# Patient Record
Sex: Female | Born: 1980 | Race: White | Hispanic: No
Health system: Midwestern US, Academic
[De-identification: ages and names within clinical notes are randomized; demographics above are authoritative.]

---

## 2019-02-27 ENCOUNTER — Encounter: Admit: 2019-02-27 | Discharge: 2019-02-27 | Primary: Primary Care

## 2019-02-27 DIAGNOSIS — R51 Headache: Secondary | ICD-10-CM

## 2019-02-27 DIAGNOSIS — K625 Hemorrhage of anus and rectum: Secondary | ICD-10-CM

## 2019-02-28 ENCOUNTER — Encounter: Admit: 2019-02-28 | Discharge: 2019-02-28 | Primary: Primary Care

## 2019-02-28 ENCOUNTER — Ambulatory Visit: Admit: 2019-02-28 | Discharge: 2019-03-01 | Primary: Primary Care

## 2019-02-28 DIAGNOSIS — K625 Hemorrhage of anus and rectum: Secondary | ICD-10-CM

## 2019-02-28 DIAGNOSIS — R51 Headache: Secondary | ICD-10-CM

## 2019-02-28 MED ORDER — NORTRIPTYLINE 25 MG PO CAP
25 mg | ORAL_CAPSULE | Freq: Every evening | ORAL | 5 refills | Status: AC
Start: 2019-02-28 — End: ?

## 2019-02-28 MED ORDER — RIZATRIPTAN 5 MG PO TBDI
5 mg | ORAL_TABLET | Freq: Every day | ORAL | 3 refills | Status: AC | PRN
Start: 2019-02-28 — End: ?

## 2019-02-28 NOTE — Progress Notes
#   of headache days out of last month:  15/30  Average duration of headaches: 24+ hours  Average severity of pain : 8/10    Is this your typical headache/migraine? yes  Any change in location/quality? no    When headaches occur:   Light sensitivity: yes  Sound sensitivity: yes  Nausea: yes  Vomiting: yes    # days over the counter medication: 15+, ibuprofen sometimes helps  # days of triptans, if any prescribed: none   # days missed work/activities per month:# none

## 2019-02-28 NOTE — Progress Notes
Obtained patient's verbal consent to treat them and their agreement to [facility] financial policy and NPP via this telehealth visit during the [facility]    The following visit was completed via Zoom (Audio/Video).    Rachael Parks is a 38 y.o. female.    CC: headaches       History of Present Illness    She presents for evaluation of headaches. These have occurred since age 4. Last saw a physician for headaches 15 years ago.    She describes migraine and headaches. With migraines she gets blurred spots in the vision. She will take 4-6 ibuprofen then take a nap. The headache then may last for 3 days. These generally consist of a dull pain. Migraines will last for 2 days if treated with ibuprofen and sleep. With migraine pain tends to be retro-orbital, but affects the whole head. Has light and sound sensitivity, nausea, and emesis.     Has at least 15-20 days with headache per month. Between 2018-2019 she presented to the ED a couple times for sudden headaches     Current meds:  Takes [phone] ibuprofen essenitally daily.    Prior meds:  topamax - tried 15 years ago. took for 2 months. no reduction in headache frequency.       Medical History:   Diagnosis Date   ??? Headache    ??? Rectal bleeding      Surgical History:   Procedure Laterality Date   ??? UMBILICAL HERNIA REPAIR       Social History     Socioeconomic History   ??? Marital status: Divorced     Spouse name: Not on file   ??? Number of children: Not on file   ??? Years of education: Not on file   ??? Highest education level: Associate degree: academic program   Occupational History   ??? Not on file   Tobacco Use   ??? Smoking status: Current Every Day Smoker     Packs/day: 1.00     Years: 21.00     Pack years: 21.00     Types: Cigarettes   ??? Smokeless tobacco: Never Used   Substance and Sexual Activity   ??? Alcohol use: Never     Frequency: Never   ??? Drug use: Never   ??? Sexual activity: Not on file   Other Topics Concern   ??? Not on file Social History Narrative   ??? Not on file     No family history on file.    Review of Systems      Objective:         ??? ALBUTEROL IN Inhale  by mouth into the lungs as Needed.   ??? IBUPROFEN PO Take  by mouth. 3600-4800mg  daily per PCP note   ??? omeprazole DR (PRILOSEC) 20 mg capsule Take 20 mg by mouth daily.   ??? ondansetron (ZOFRAN ODT) 4 mg rapid dissolve tablet DISSOLVE 1 TABLET IN MOUTH EVERY 8 HOURS AS NEEDED FOR NAUSEA AND VOMITING     Vitals:    02/28/19 0927   Weight: 88.5 kg (195 lb)   Height: 170.2 cm (67)   PainSc: Six     Body mass index is 30.54 kg/m???.     Physical Exam    Alert and in no distress.  Converses and answers questions appropriately.  Speech is normal without dysarthria.  Face is symmetric with normal movements.         Assessment and Plan:  Impression: Chronic migraine with 15-20 headache days per month, estimates 8 days per month with migraine specifically. Discussed the classification of chronic migraine which is characterized by greater than 15 days of headache per month and the role of both abortive and preventative treatments. Discussed the role of traditional medications for headache prevention as well as FDA approved treatments which include botulinum injections and CGRP antagonists.     Plan:  1. Start 25mg  nortriptyline nightly for prevention.  2. Start 5mg  maxalt as needed for migraine.  3. Obtain MRI head w/o contrast.  4. Can continue to use ibuprofen as needed. Would not take more than 800mg  at a time and recommend using no more than 10-12 days per month.  5. Follow up in about 4 months.    Total time 36 minutes.  Estimated counseling time 20 minutes.  Counseled patient regarding migraine prevention and acute treatment.

## 2019-03-01 DIAGNOSIS — G43709 Chronic migraine without aura, not intractable, without status migrainosus: Principal | ICD-10-CM

## 2019-03-08 ENCOUNTER — Encounter: Admit: 2019-03-08 | Discharge: 2019-03-08 | Primary: Primary Care

## 2019-03-08 DIAGNOSIS — G43709 Chronic migraine without aura, not intractable, without status migrainosus: Secondary | ICD-10-CM

## 2019-11-14 IMAGING — CT STONE PROTOCOL(Adult)
2 of 3 series · 12 of 46 positions shown, 14 images · non-contrast
Comparison: none

[Series 2: abdomen ax 3.00 br40 s3 · axial · 0.59mm/px · z∈[+1181,+1607]mm · 9 of 162 slices shown, 11 images]
[im 11/162  soft-tissue]
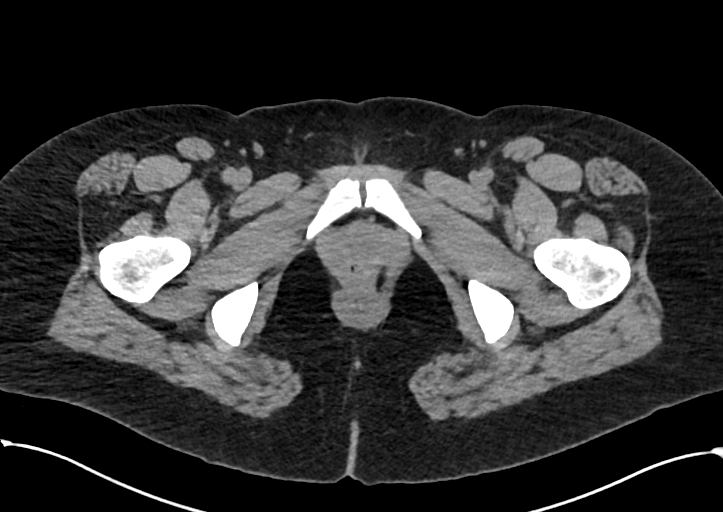
[im 11/162  bone]
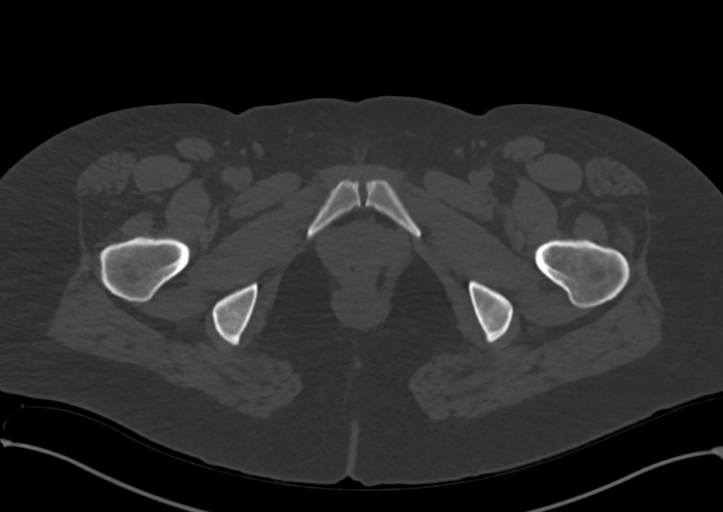
[im 32/162  soft-tissue]
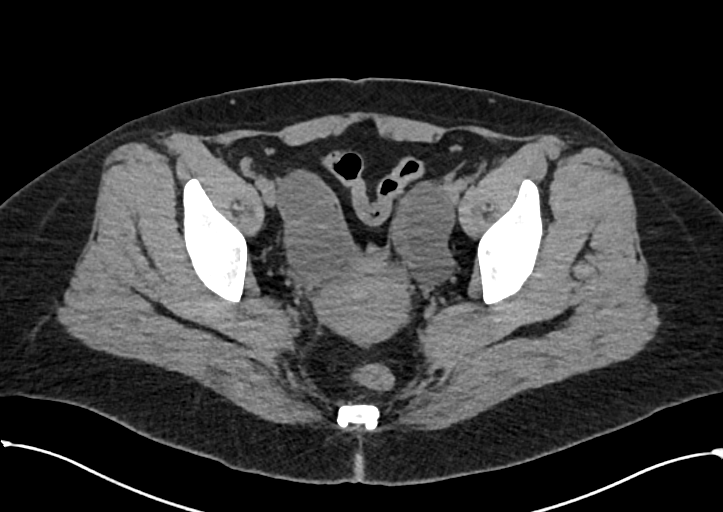
[im 47/162  soft-tissue]
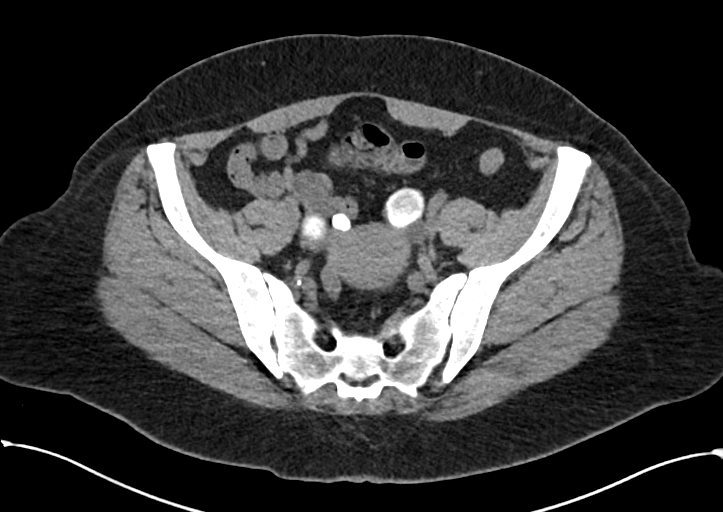
[im 63/162  soft-tissue]
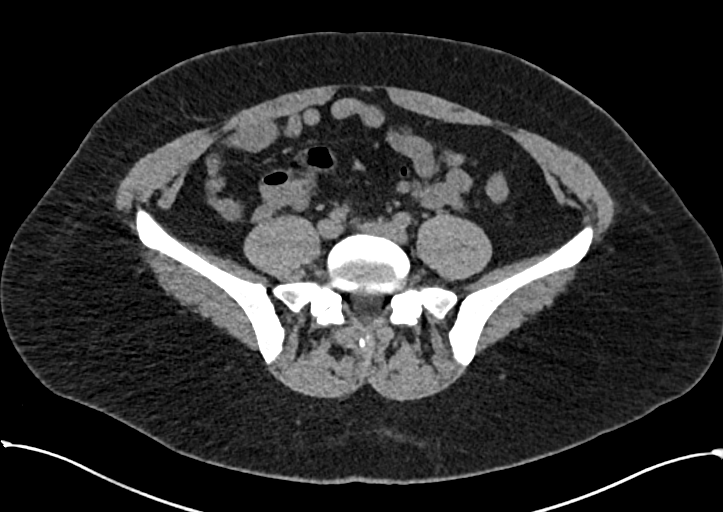
[im 84/162  soft-tissue]
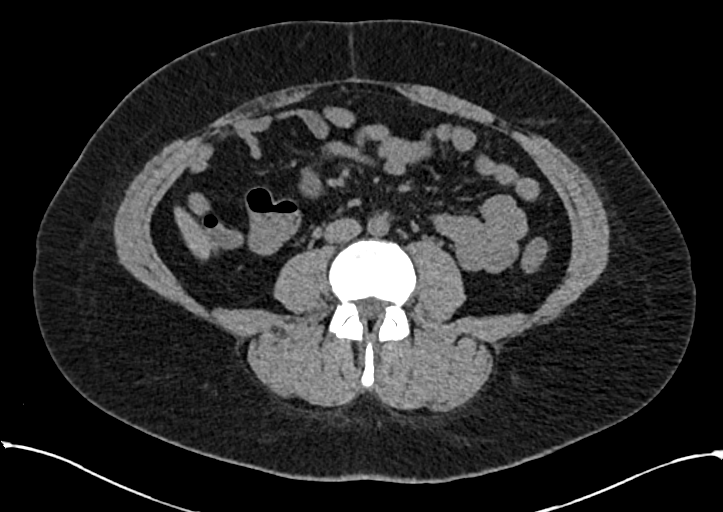
[im 99/162  soft-tissue]
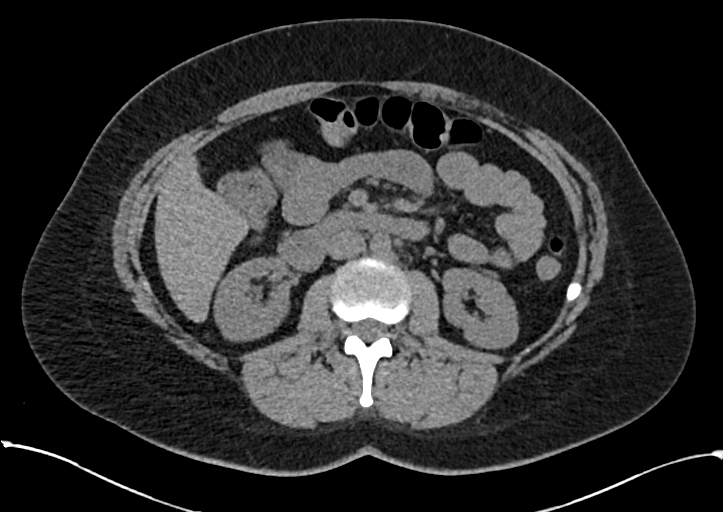
[im 115/162  soft-tissue]
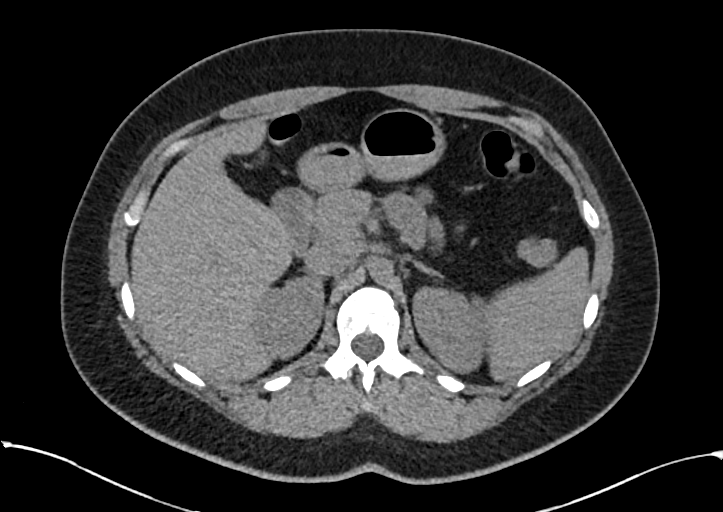
[im 136/162  soft-tissue]
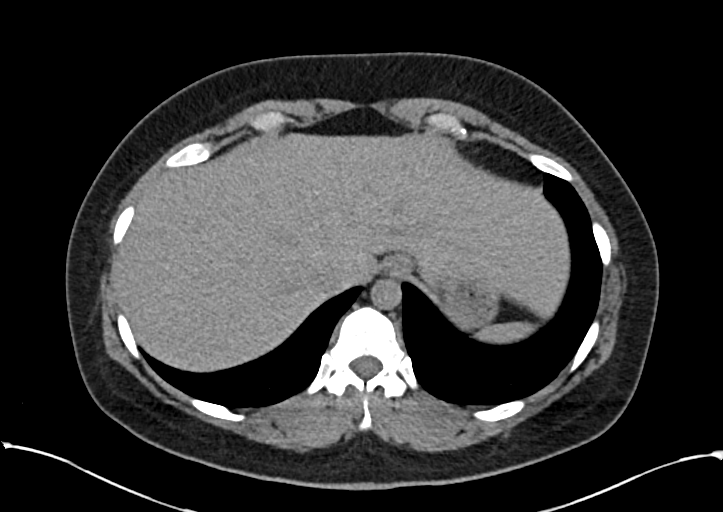
[im 151/162  soft-tissue]
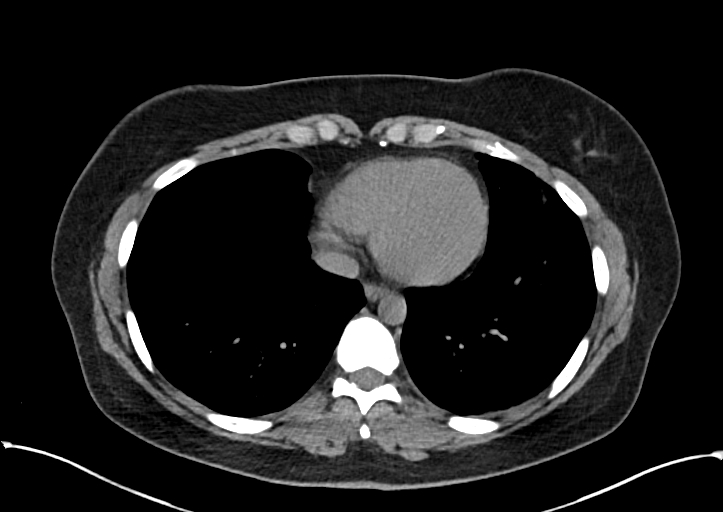
[im 151/162  bone]
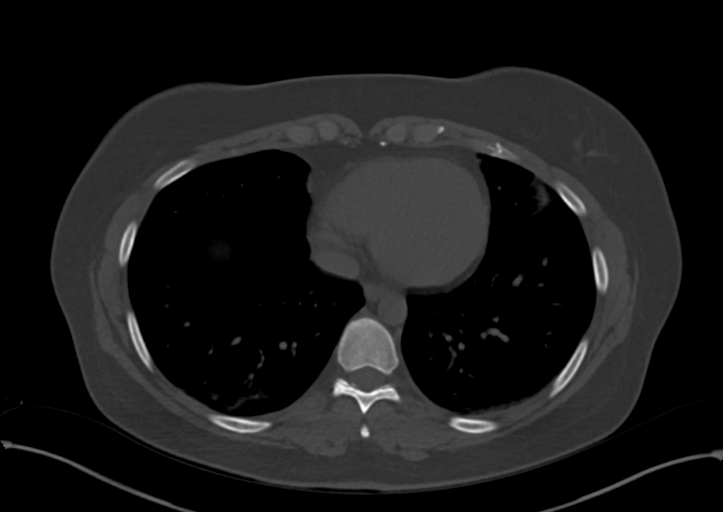

[Series 4: abdomen cor 3.00 br40 s3 · coronal · 0.83mm/px · 3 of 99 slices shown]
[im 33/99  soft-tissue]
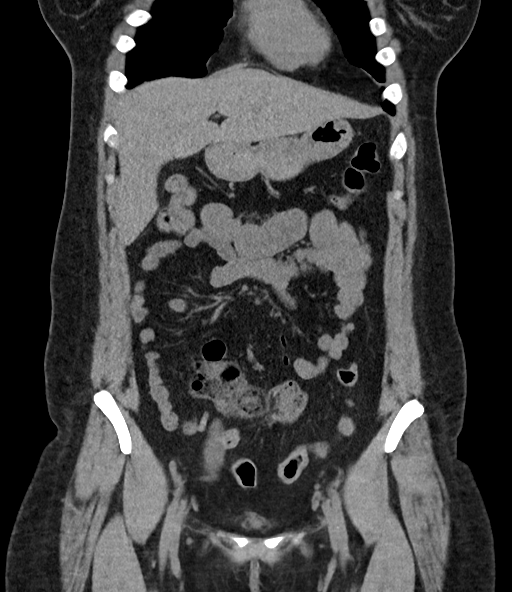
[im 44/99  soft-tissue]
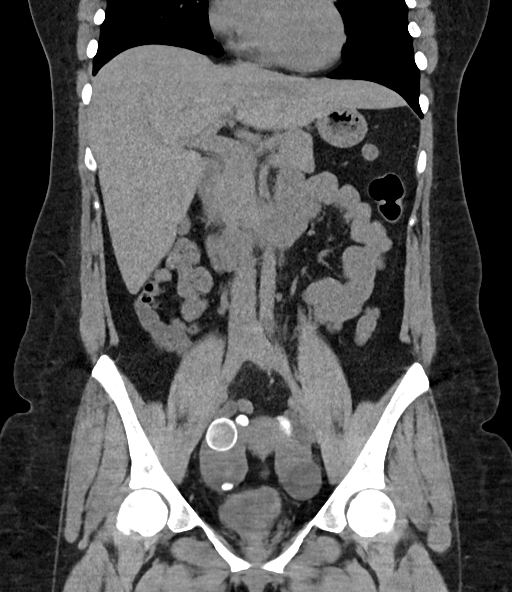
[im 55/99  soft-tissue]
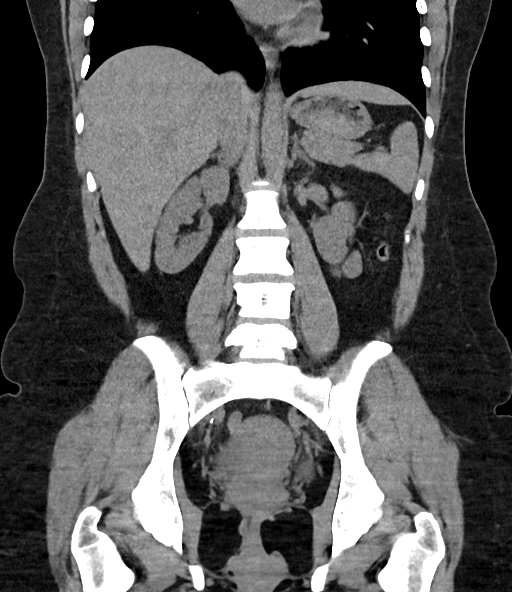

[12 of 46 positions shown; findings below may reference images not displayed]

EXAM

CT abd/pel wo con

INDICATION

Right lower quadrant abdominal pain, history of kidney stones

TECHNIQUE

CT of the abdomen and pelvis was performed. All CT scans at this facility use dose modulation,
iterative reconstruction, and/or weight based dosing when appropriate to reduce radiation dose to as
low as reasonably achieved.

# of CT scans in the past year: 0

# of Myocardial perfusion scans this past year: 0

COMPARISONS

01/22/2019

FINDINGS

Lung bases: No pleural effusion or suspicious pulmonary nodule in the lung bases. Normal cardiac
size without a pericardial effusion.

Liver: Normal hepatic size without a suspicious focal lesion.

Gallbladder and Biliary Tree: Cholecystectomy. No intrahepatic or extrahepatic biliary dilation.

Spleen: Unremarkable

Pancreas: Unremarkable

Adrenal Glands: Unremarkable

Kidneys: Symmetric size of the kidneys. Trace 1 millimeter hyperdensity which may represent a small
nonobstructive kidney stone within the right kidney (series 2, image 60). No hydroureteronephrosis
or renal stone.

Bladder: Decompressed limiting its evaluation

Pelvic Organs: Probably calcified subserosal/exophytic fundal myometrial possible fibroids
measuring up to 3.1 cm. There are at least 3 exophytic calcified fibroid spurred prominent cystic
filled bilateral ovaries measuring up to 6.2 cm.

Bowel: The stomach is normal.   There is normal caliber of the small and large bowel without
evidence of bowel obstruction.   Normal appendix visualized within the the mid aspect of the lower
abdomen (series 2, image 98). The appendix is filled with air. There is no free air.

Ascites: Absent

Lymphadenopathy: No pathologically enlarged or morphologically abnormal lymph nodes by CT
appearance.

Vasculature: No aneurysmal dilatation

Abdominal Wall and Mesentery: Small fat containing umbilical

Musculoskeletal: No acute fracture or aggressive focal osseous lesion

IMPRESSION
1. No CT evidence of an acute abdominal or pelvic process.
2. Trace 1 millimeter nonobstructive right kidney stone. No hydronephrosis.
3. At least 3 exophytic calcified subserosal fibroids as detailed above.
4. Unchanged appearance of the cystic dilatation of bilateral ovaries.
5. Normal appendix.

Tech Notes:

Pt c/o RLQ pain. H/o kidney stones, cholecystectomy, and hernia repair. Neg. preg. CT/NM 1/0. CC

## 2020-07-31 IMAGING — CR CHEST
1 series · 1 of 1 positions shown · non-contrast
Comparison: none

[chest port x-wise]
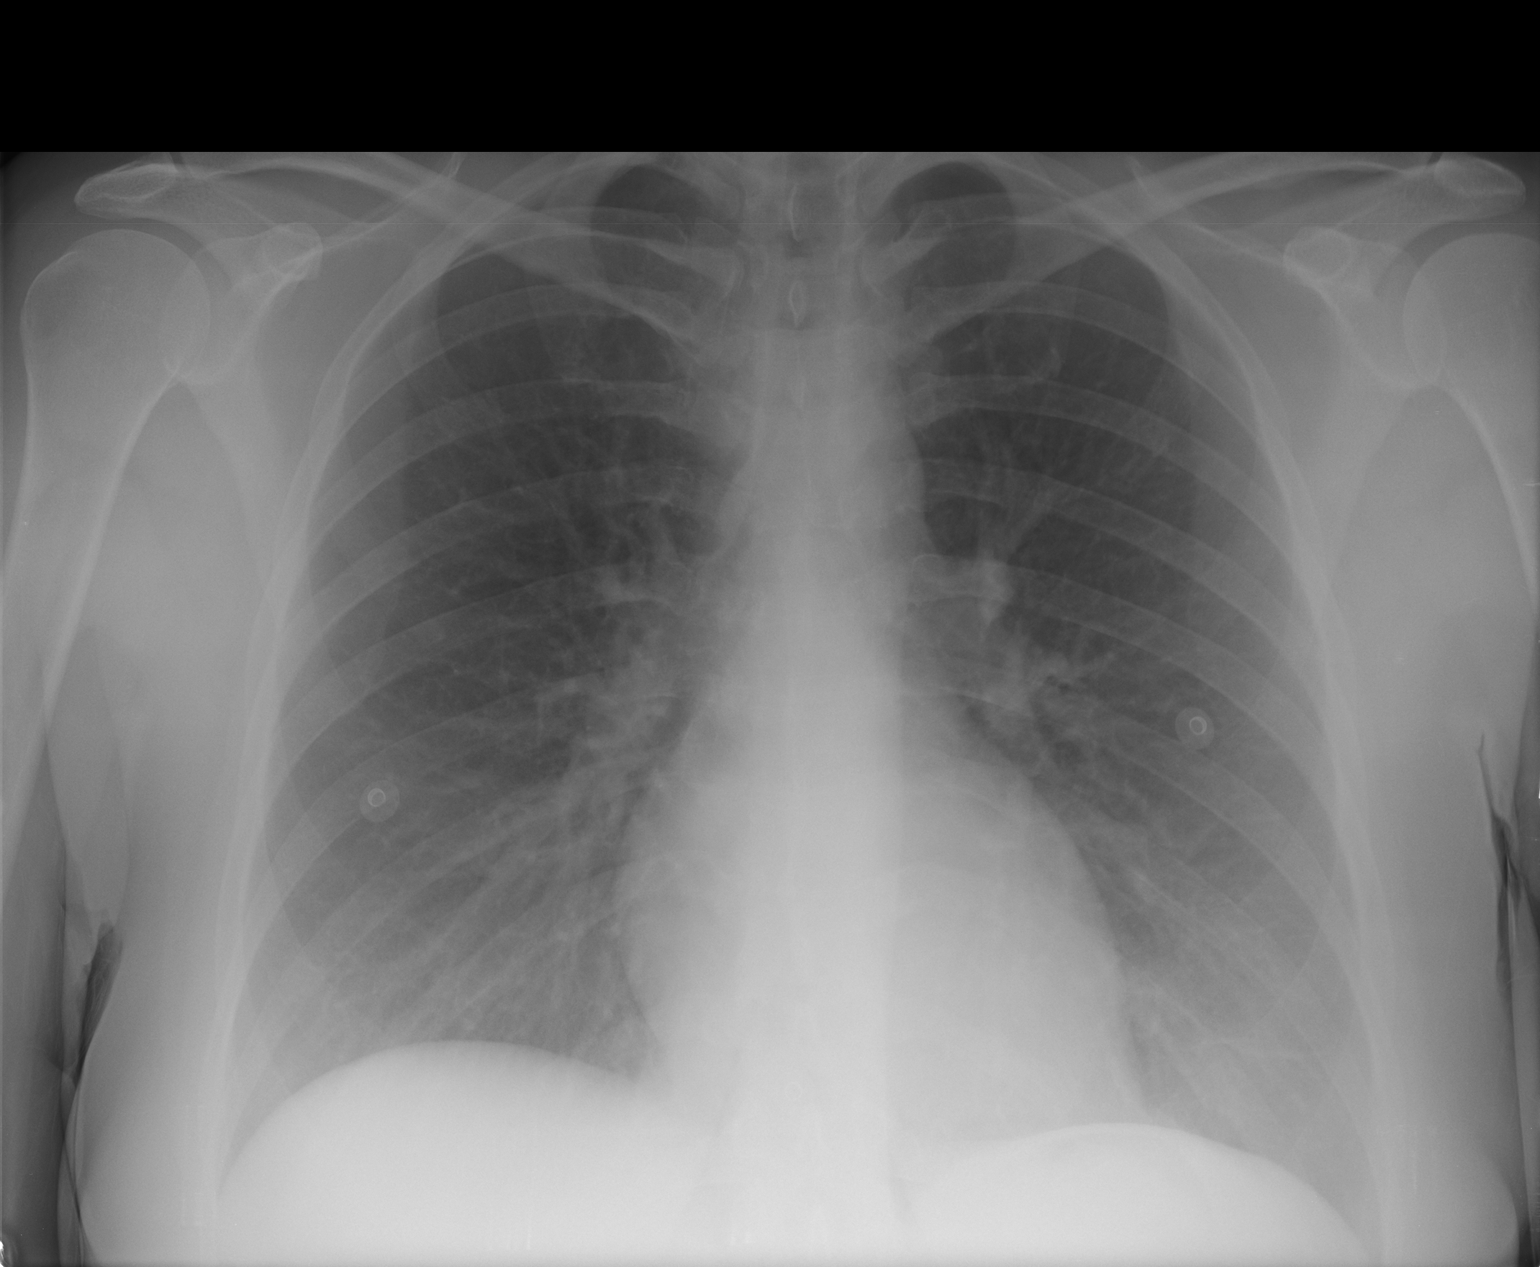

[1 of 1 positions shown; findings below may reference images not displayed]

EXAM

XR chest 1V

INDICATION

cp
C/O CHEST PAIN OFF AND ON X 2 WEEKS. CHEST PAIN CAUSES HER TO BECOME SOA. H/O ASTHMA AS CHILD.
DENIES PREGNANCY. CT/NM 0/0

TECHNIQUE

Single view of the chest

COMPARISONS

None available at the time of dictation.

FINDINGS

No radiographically apparent pleural effusion, consolidation, or pneumothorax.

The cardiomediastinal silhouette is  normal in size.

The osseous structures are without an acute osseous abnormality.

IMPRESSION
1. No radiographic evidence of an acute cardiopulmonary process.

Tech Notes:

C/O CHEST PAIN OFF AND ON X 2 WEEKS. CHEST PAIN CAUSES HER TO BECOME SOA. H/O ASTHMA AS CHILD.
DENIES PREGNANCY. CT/NM 0/0
